# Patient Record
Sex: Male | Born: 1970 | Race: White | Hispanic: No | Marital: Married | Smoking: Current every day smoker
Health system: Southern US, Community
[De-identification: ages and names within clinical notes are randomized; demographics above are authoritative.]

## PROBLEM LIST (undated history)

## (undated) DIAGNOSIS — M069 Rheumatoid arthritis, unspecified: Secondary | ICD-10-CM

---
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
## 2009-07-25 ENCOUNTER — Ambulatory Visit: Payer: Self-pay | Admitting: Family Medicine

## 2010-11-15 ENCOUNTER — Ambulatory Visit: Payer: Self-pay | Admitting: Internal Medicine

## 2012-11-27 ENCOUNTER — Ambulatory Visit: Payer: Self-pay

## 2012-12-23 ENCOUNTER — Ambulatory Visit: Payer: Self-pay | Admitting: Unknown Physician Specialty

## 2014-05-19 ENCOUNTER — Ambulatory Visit: Payer: Self-pay | Admitting: Physician Assistant

## 2014-09-15 ENCOUNTER — Ambulatory Visit: Payer: Self-pay | Admitting: Physician Assistant

## 2014-12-20 ENCOUNTER — Ambulatory Visit
Admission: EM | Admit: 2014-12-20 | Discharge: 2014-12-20 | Disposition: A | Discharge status: Home / Self Care | Payer: Commercial Indemnity | Attending: Internal Medicine | Admitting: Internal Medicine

## 2014-12-20 ENCOUNTER — Ambulatory Visit: Payer: Commercial Indemnity

## 2014-12-20 DIAGNOSIS — M19039 Primary osteoarthritis, unspecified wrist: Secondary | ICD-10-CM

## 2014-12-20 DIAGNOSIS — M1990 Unspecified osteoarthritis, unspecified site: Secondary | ICD-10-CM | POA: Insufficient documentation

## 2014-12-20 DIAGNOSIS — M79641 Pain in right hand: Secondary | ICD-10-CM | POA: Diagnosis present

## 2014-12-20 DIAGNOSIS — M129 Arthropathy, unspecified: Secondary | ICD-10-CM

## 2014-12-20 DIAGNOSIS — Z79899 Other long term (current) drug therapy: Secondary | ICD-10-CM | POA: Insufficient documentation

## 2014-12-20 DIAGNOSIS — F17210 Nicotine dependence, cigarettes, uncomplicated: Secondary | ICD-10-CM | POA: Diagnosis not present

## 2014-12-20 MED ORDER — MELOXICAM 15 MG PO TABS: 15.0000 mg | ORAL_TABLET | Freq: Every day | ORAL | Status: DC

## 2014-12-20 NOTE — ED Provider Notes (Signed)
CSN: [id number]     Arrival date & time 12/20/14  1746 History   First MD Initiated Contact with Patient 12/20/14 1957     Chief Complaint  Patient presents with  . Hand Pain   (Consider location/radiation/quality/duration/timing/severity/associated sxs/prior Treatment) HPI   This a 44 year old male presents with a three-day history of right dominant hand pain mostly over the dorsum. He does not remember any specific injury has not been using his hand for repetitive motion. Wrist flexion and extension and abduction of his fingers seem to be the worst movements. He has slight swelling of the wrist in comparison to the left but no ecchymosis or erythema. There is slight warmth in comparison. No history of gout and is in usual good health. He works for a Location [employer] not a physical job.    History reviewed. No pertinent past medical history. Past Surgical History  Procedure Laterality Date  . Knee surgery Bilateral 1991   Family History  Problem Relation Age of Onset  . Prostate cancer Father   . Hypertension Father   . Hyperlipidemia Father    History  Substance Use Topics  . Smoking status: Current Every Day Smoker -- 1.00 packs/day for 25 years  . Smokeless tobacco: Not on file  . Alcohol Use: Yes     Comment: "couple times a week"    Review of Systems  Musculoskeletal: Positive for joint swelling.  All other systems reviewed and are negative.   Allergies  Lorabid  Home Medications   Prior to Admission medications   Medication Sig Start Date End Date Taking? Authorizing Provider  cetirizine (ZYRTEC) 10 MG tablet Take 10 mg by mouth daily.   Yes Historical Provider, MD  Multiple Vitamin (MULTIVITAMIN) capsule Take 1 capsule by mouth daily.   Yes Historical Provider, MD  meloxicam (MOBIC) 15 MG tablet Take 1 tablet (15 mg total) by mouth daily. 12/20/14   Chrissie Noa Maeven Mcdougall, PA-C   BP 137/88 mmHg  Pulse 88  Temp(Src) 98.4 F (36.9 C) (Oral)  Resp 16  Ht 6\' 5"  (1.956  m)  Wt 260 lb (117.935 kg)  BMI 30.83 kg/m2  SpO2 97% Physical Exam  Constitutional: He is oriented to person, place, and time. He appears well-developed and well-nourished.  HENT:  Head: Normocephalic and atraumatic.  Eyes: Pupils are equal, round, and reactive to light.  Musculoskeletal:  Examination of the right hand be slightly swollen in comparison to the left. There is no erythema or ecchymosis present. He is able to pronate and supinate but flexion and extension is limited by pain. There is no tenderness over the phalanges or metacarpals at the base of the third metacarpal at the carpometacarpal joint the most tender. There is no induration. Pulses are good and strong sensation is intact to light touch. Left wrist examination is entirely normal.  Neurological: He is alert and oriented to person, place, and time. He has normal reflexes.  Skin: Skin is warm and dry.  Psychiatric: He has a normal mood and affect. His behavior is normal. Judgment and thought content normal.    ED Course  Procedures (including critical care time) Labs Review Labs Reviewed - No data to display  Imaging Review Dg Wrist Complete Right  12/20/2014   CLINICAL DATA:  Dorsal hand pain over base of the third metacarpal. No known injury.  EXAM: RIGHT WRIST - COMPLETE 3+ VIEW  COMPARISON:  None.  FINDINGS: The mineralization and alignment are normal. There is no evidence of acute fracture  or dislocation. The joint spaces are maintained. There is a possible os styloideum at the base of the third metacarpal base on the PA view, although not well seen on the lateral view. No evidence of avascular necrosis.  IMPRESSION: No acute osseous findings. Potential os styloideum accounting for the patient's palpable concern. Correlate clinically.   Electronically Signed   By: Carey Bullocks M.D.   On: 12/20/2014 20:24     MDM   1. Wrist joint inflamed    I show the patient the x-rays and the report and we'll place him on  NSAID's placement a wrist splint for a week to see how he does conservatively. He continues to have discomfort I would refer him to hand surgery and have given him the name of [facility] or he can go to anywhere he pleases    Lutricia Feil, PA-C 12/20/14 2056

## 2014-12-20 NOTE — ED Notes (Signed)
Right hand swollen, red, and tender since Monday morning. Sudden onset with no known trauma to the affected hand. Pt reports the hand is severely tender to the touch.

## 2015-09-05 ENCOUNTER — Other Ambulatory Visit: Payer: Self-pay | Admitting: Internal Medicine

## 2015-09-05 ENCOUNTER — Ambulatory Visit
Admission: RE | Admit: 2015-09-05 | Discharge: 2015-09-05 | Disposition: A | Discharge status: Home / Self Care | Payer: Commercial Indemnity | Source: Ambulatory Visit | Attending: Internal Medicine | Admitting: Internal Medicine

## 2015-09-05 DIAGNOSIS — M25542 Pain in joints of left hand: Secondary | ICD-10-CM | POA: Diagnosis not present

## 2015-09-05 DIAGNOSIS — M25571 Pain in right ankle and joints of right foot: Secondary | ICD-10-CM | POA: Insufficient documentation

## 2015-09-05 DIAGNOSIS — M1990 Unspecified osteoarthritis, unspecified site: Secondary | ICD-10-CM | POA: Diagnosis present

## 2015-09-05 DIAGNOSIS — M25572 Pain in left ankle and joints of left foot: Secondary | ICD-10-CM | POA: Insufficient documentation

## 2015-09-05 DIAGNOSIS — M533 Sacrococcygeal disorders, not elsewhere classified: Secondary | ICD-10-CM | POA: Diagnosis not present

## 2015-09-05 DIAGNOSIS — M19079 Primary osteoarthritis, unspecified ankle and foot: Secondary | ICD-10-CM

## 2015-09-05 DIAGNOSIS — M25541 Pain in joints of right hand: Secondary | ICD-10-CM | POA: Diagnosis not present

## 2017-01-11 ENCOUNTER — Ambulatory Visit
Admission: EM | Admit: 2017-01-11 | Discharge: 2017-01-11 | Disposition: A | Discharge status: Home / Self Care | Payer: Commercial Indemnity | Attending: Family Medicine | Admitting: Family Medicine

## 2017-01-11 DIAGNOSIS — H00024 Hordeolum internum left upper eyelid: Secondary | ICD-10-CM

## 2017-01-11 HISTORY — DX: Rheumatoid arthritis, unspecified: M06.9

## 2017-01-11 MED ORDER — ERYTHROMYCIN 5 MG/GM OP OINT: TOPICAL_OINTMENT | OPHTHALMIC | 0 refills | Status: DC

## 2017-01-11 NOTE — ED Provider Notes (Signed)
CSN: [id number]     Arrival date & time 01/11/17  0846 History   First MD Initiated Contact with Patient 01/11/17 [phone]     Chief Complaint  Patient presents with  . Eye Problem   (Consider location/radiation/quality/duration/timing/severity/associated sxs/prior Treatment) 46 year old male presents with left upper eyelid redness and swelling that started about 3 days ago. Experiencing some cloudy to yellow discharge. Tender on upper lid and itchy. No distinct change in vision (vision is normally worse in left eye compared to right). Does not wear contacts but wears glasses (not with him today). Had routine vision exam a week ago- no issues. Also was exposed to possible conjunctivitis or pink eye by a relative earlier last week. Denies any fever, nasal congestion, ear pain, sore throat or cough. Tried OTC eye drops for eye redness with no relief. Only chronic health issue is Rheumatoid Arthritis.    The history is provided by the patient.    Past Medical History:  Diagnosis Date  . Rheumatoid arthritis [facility])    Past Surgical History:  Procedure Laterality Date  . KNEE SURGERY Bilateral 1991   Family History  Problem Relation Age of Onset  . Prostate cancer Father   . Hypertension Father   . Hyperlipidemia Father    Social History  Substance Use Topics  . Smoking status: Current Every Day Smoker    Packs/day: 1.00    Years: 25.00  . Smokeless tobacco: Never Used  . Alcohol use Yes     Comment: "couple times a week"    Review of Systems  Constitutional: Negative for appetite change, chills, fatigue and fever.  HENT: Negative for congestion, ear discharge, ear pain, facial swelling, postnasal drip, rhinorrhea, sinus pain, sinus pressure, sneezing and sore throat.   Eyes: Positive for pain (left upper lid sore), discharge (from eyelid) and itching. Negative for photophobia, redness and visual disturbance.  Respiratory: Negative for cough, chest tightness, shortness of breath and  wheezing.   Cardiovascular: Negative for chest pain.  Gastrointestinal: Negative for diarrhea, nausea and vomiting.  Musculoskeletal: Negative for back pain and neck pain.  Skin: Positive for color change. Negative for rash and wound.  Allergic/Immunologic: Positive for immunocompromised state.  Neurological: Negative for dizziness, tremors, syncope, facial asymmetry, speech difficulty, weakness, light-headedness, numbness and headaches.  Hematological: Negative for adenopathy.    Allergies  Lorabid [loracarbef]  Home Medications   Prior to Admission medications   Medication Sig Start Date End Date Taking? Authorizing Provider  cetirizine (ZYRTEC) 10 MG tablet Take 10 mg by mouth daily.   Yes [provider]  cyclobenzaprine (FLEXERIL) 10 MG tablet Take 10 mg by mouth 3 (three) times daily as needed for muscle spasms.   Yes [provider]  Etanercept (ENBREL) 25 MG/0.5ML SOSY Inject into the skin.   Yes [provider]  HYDROcodone-acetaminophen (NORCO/VICODIN) 5-325 MG tablet Take 2 tablets by mouth every 6 (six) hours as needed for moderate pain.   Yes [provider]  ibuprofen (ADVIL,MOTRIN) 800 MG tablet Take 800 mg by mouth every 8 (eight) hours as needed.   Yes [provider]  Multiple Vitamin (MULTIVITAMIN) capsule Take 1 capsule by mouth daily.   Yes [provider]  omeprazole (PRILOSEC) 10 MG capsule Take 10 mg by mouth daily.   Yes [provider]  erythromycin ophthalmic ointment Place a 1/2 inch ribbon of ointment into the lower eyelid 4 times a day. 01/11/17   Sudie Grumbling, NP   Meds Ordered  and Administered this Visit  Medications - No data to display  BP (!) 120/93 (BP Location: Left Arm)   Pulse 81   Temp 98.2 F (36.8 C) (Oral)   Resp 18   Ht 6\' 5"  (1.956 m)   Wt 255 lb (115.7 kg)   SpO2 98%   BMI 30.24 kg/m  No data found.   Physical Exam  Constitutional: He is oriented to person, place,  and time. He appears well-developed and well-nourished. No distress.  HENT:  Head: Normocephalic and atraumatic.  Right Ear: Hearing and external ear normal.  Left Ear: Hearing and external ear normal.  Nose: Nose normal.  Mouth/Throat: Oropharynx is clear and moist.  Eyes: EOM are normal. Pupils are equal, round, and reactive to light. Right eye exhibits no discharge and no hordeolum. No foreign body present in the right eye. Left eye exhibits discharge and hordeolum. No foreign body present in the left eye. Right conjunctiva is not injected. Left conjunctiva is not injected.    Small red stye present on left upper inner lateral aspect of eyelid. Slight yellowish drainage present. Upper lid swollen and red and tender. Conjunctiva is normal.   Neck: Normal range of motion. Neck supple.  Cardiovascular: Normal rate and regular rhythm.   Pulmonary/Chest: Effort normal.  Lymphadenopathy:    He has no cervical adenopathy.  Neurological: He is alert and oriented to person, place, and time.  Skin: Skin is warm, dry and intact. There is erythema (on left upper lateral 2/3 of eyelid).  Psychiatric: He has a normal mood and affect. His behavior is normal. Judgment and thought content normal.    Urgent Care Course     Procedures (including critical care time)  Labs Review Labs Reviewed - No data to display  Imaging Review No results found.   Visual Acuity Review  Right Eye Distance: 20/15 (corrected) Left Eye Distance: 20/70 (corrected) due to swelling of eyelid  Bilateral Distance: 20/25 (corrected)  Right Eye Near:   Left Eye Near:    Bilateral Near:         MDM   1. Hordeolum internum of left upper eyelid    Recommend start Erythromycin ointment- place a small layer in left eye 4 times a day as directed. Continue warm compresses to eyelid for comfort. Wash hands frequently. Follow-up with an Eye doctor (information provided for [facility]) in 2 to 3 days if not  improving.      , NP 01/12/17 [phone]

## 2017-01-11 NOTE — ED Triage Notes (Signed)
Patient complains of left eye swelling. Patient states that area is painful and noticed visual changes. Patient states that he recently had eyes examined last Monday.

## 2017-01-11 NOTE — Discharge Instructions (Addendum)
Recommend start Erythromycin ointment- place a small layer in left eye 4 times a day as directed. Continue warm compresses to eyelid for comfort. Follow-up with an Eye doctor in 2 to 3 days if not improving.

## 2017-02-17 IMAGING — CR DG FOOT 2V*R*
2 series · 2 of 2 positions shown · non-contrast
Comparison: Concurrently obtained radiographs of the left foot

CLINICAL DATA: 44-year-old male with painful feet

EXAM:
RIGHT FOOT - 2 VIEW

[foot ap]
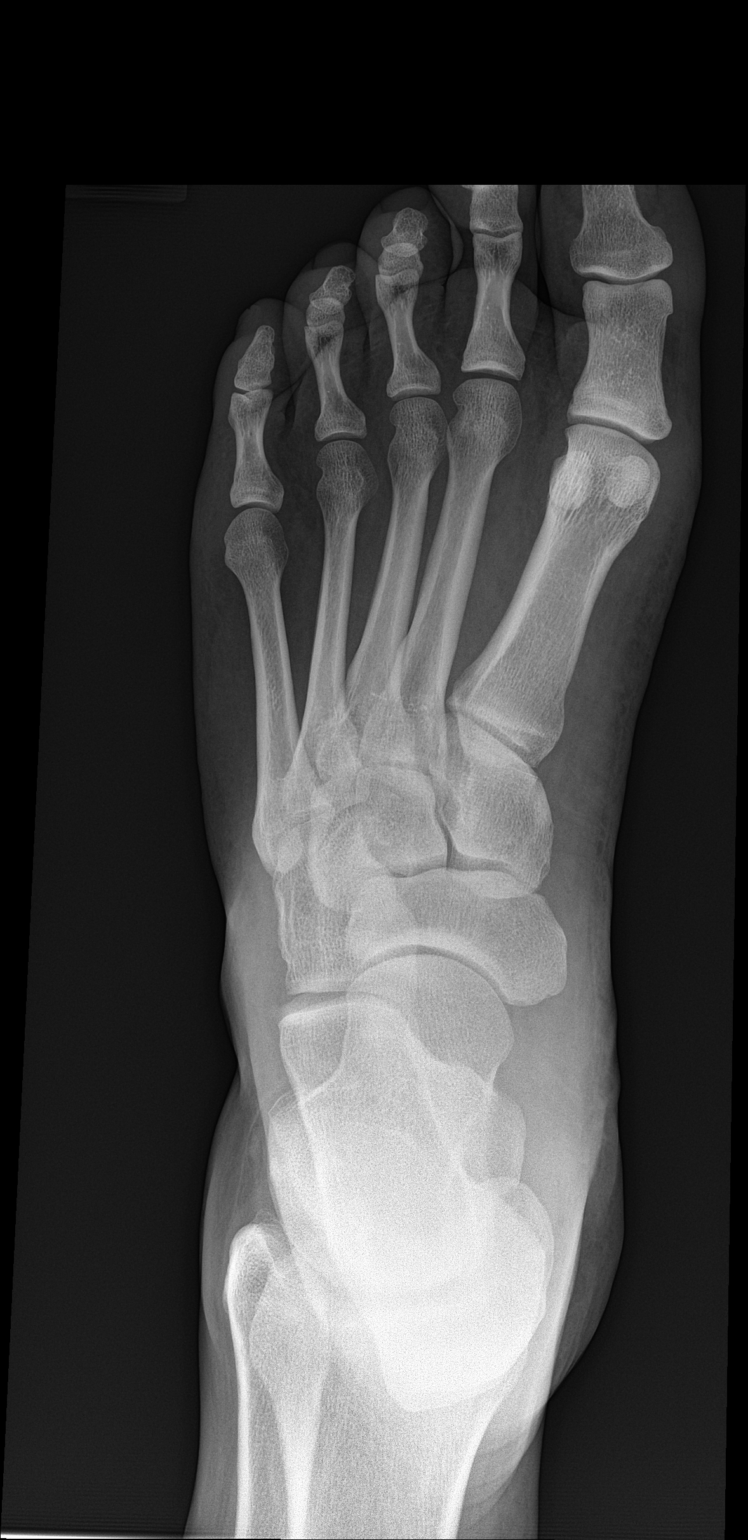

[foot lat]
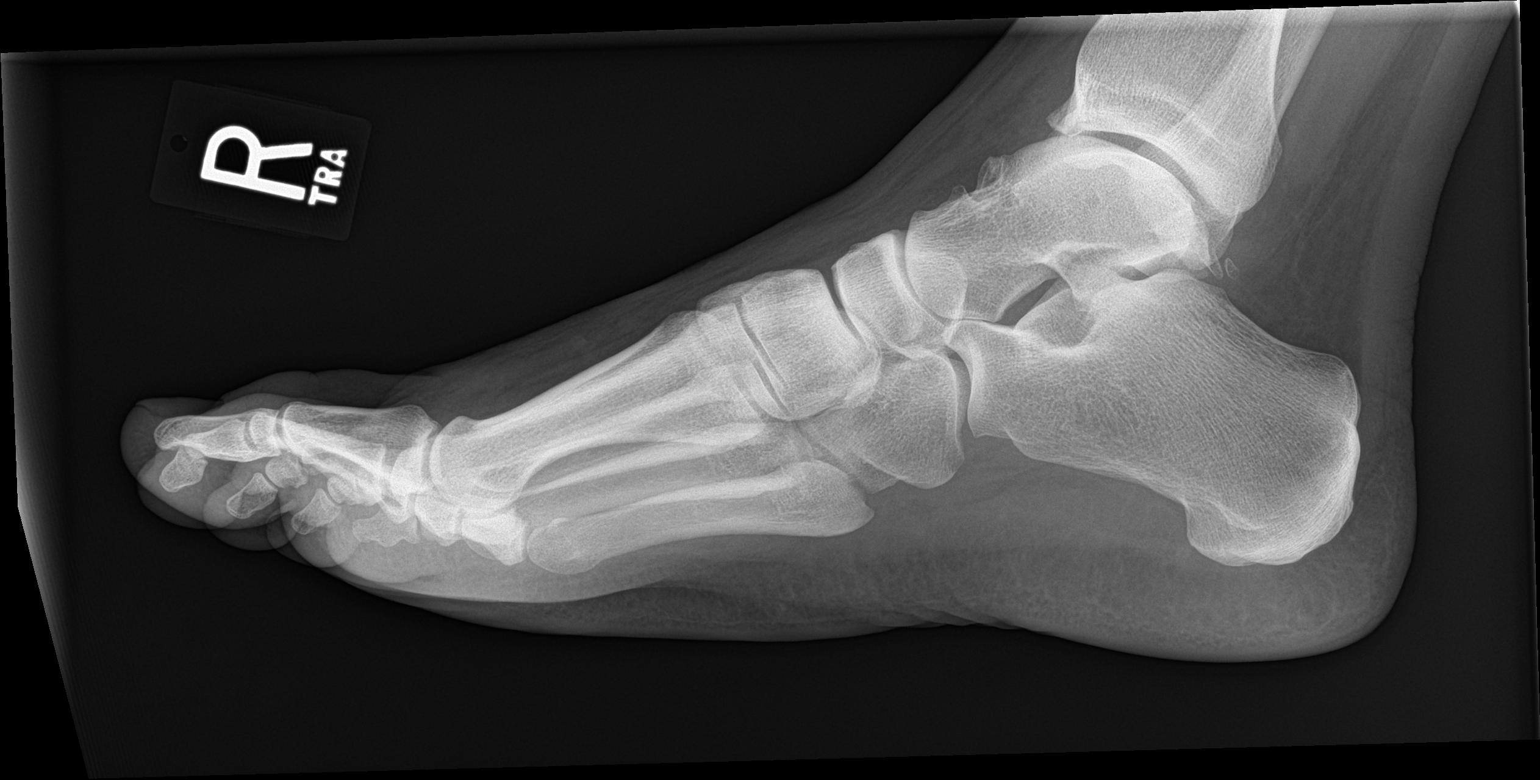

[2 of 2 positions shown; findings below may reference images not displayed]

FINDINGS: There is no evidence of fracture or dislocation. There is no
evidence of arthropathy or other focal bone abnormality. Soft
tissues are unremarkable. Small os trigonum.
IMPRESSION: Negative.

## 2017-02-17 IMAGING — CR DG FOOT 2V*L*
2 series · 2 of 2 positions shown · non-contrast
Comparison: Concurrently obtained radiographs of the right foot

CLINICAL DATA: 44-year-old male with painful feet

EXAM:
LEFT FOOT - 2 VIEW

[foot ap]
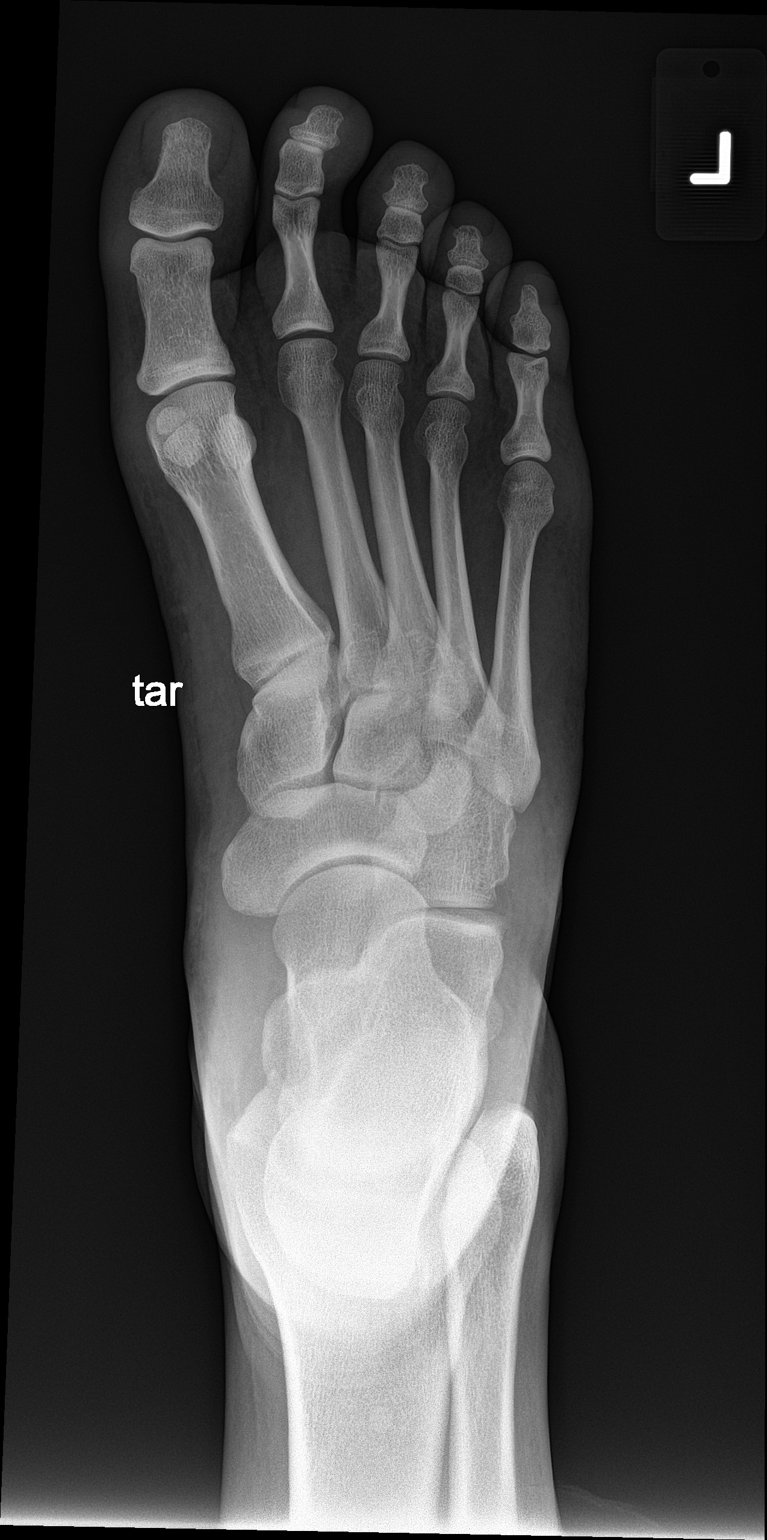

[foot lat]
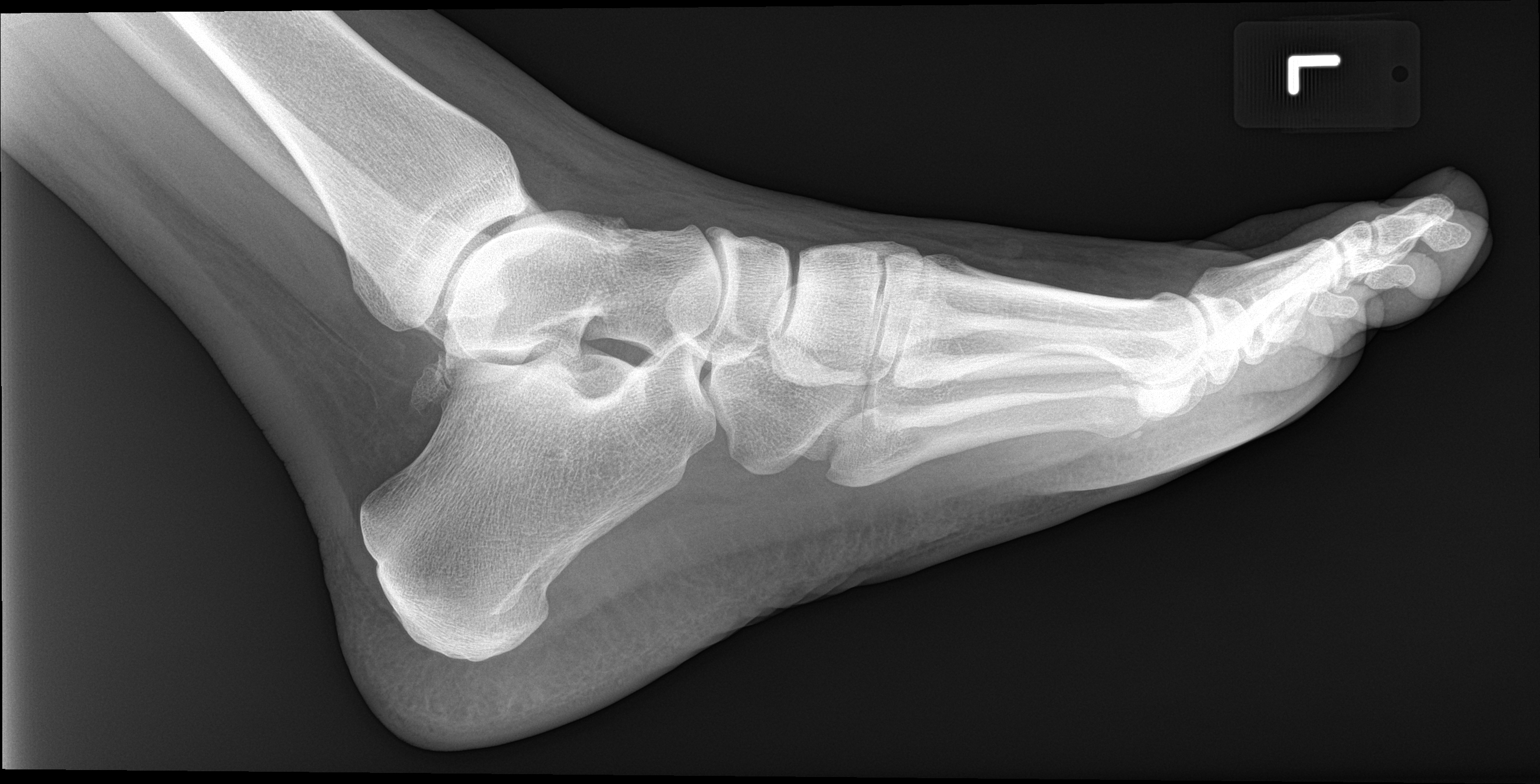

[2 of 2 positions shown; findings below may reference images not displayed]

FINDINGS: There is no evidence of fracture or dislocation. There is no
evidence of arthropathy or other focal bone abnormality. Soft
tissues are unremarkable. Os trigonum noted incidentally.
IMPRESSION: Negative.

## 2017-02-17 IMAGING — CR DG HAND COMPLETE 3+V*L*
3 series · 3 of 3 positions shown · non-contrast
Comparison: Concurrently obtained radiographs of the right hands

CLINICAL DATA: 44-year-old male with painful swollen hands

EXAM:
LEFT HAND - COMPLETE 3+ VIEW

[hand ap]
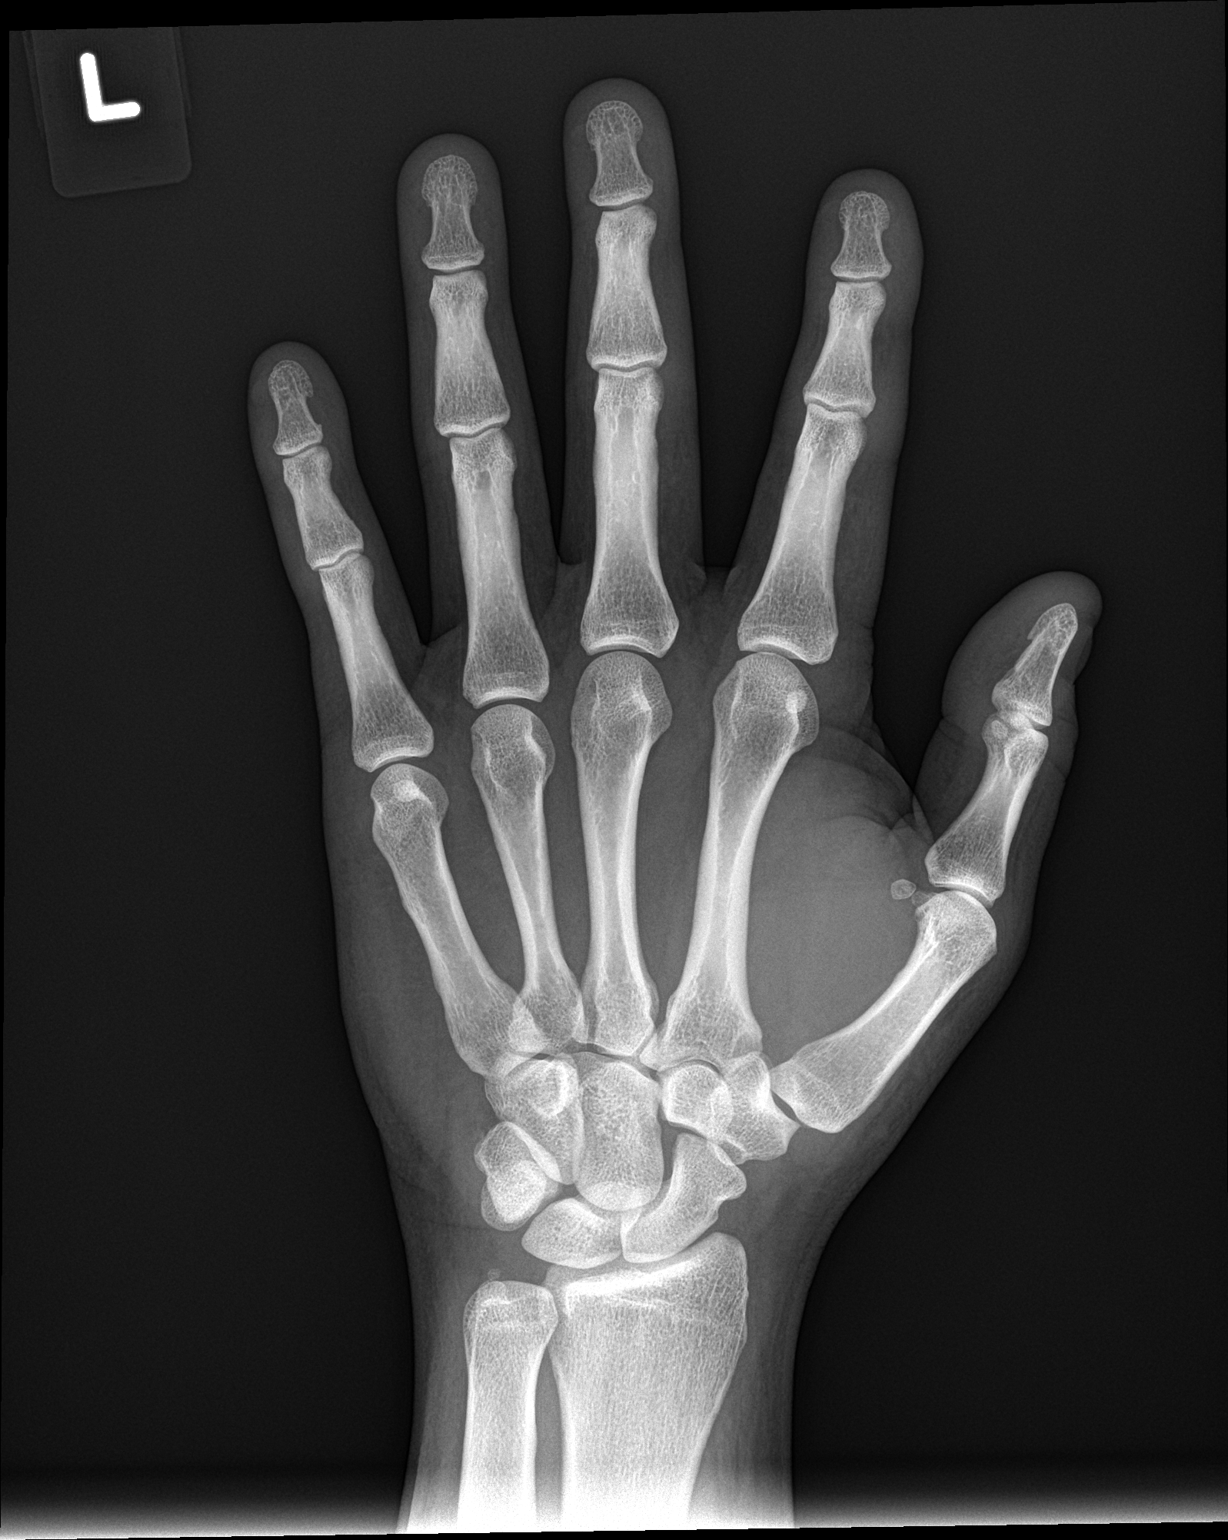

[hand obl]
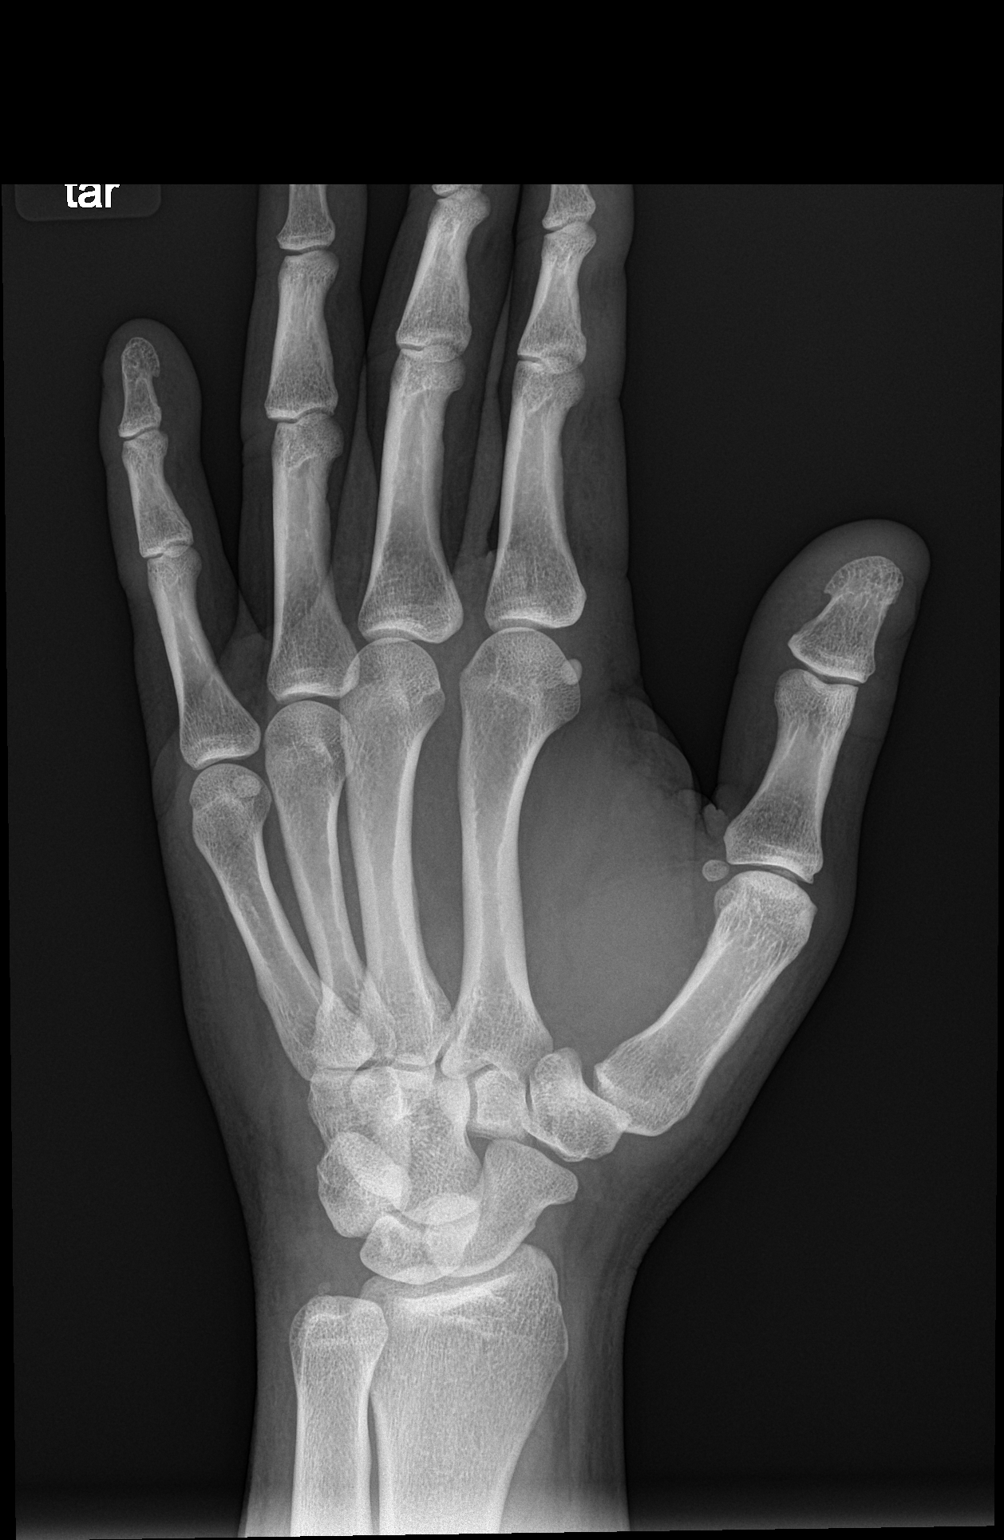

[hand lat]
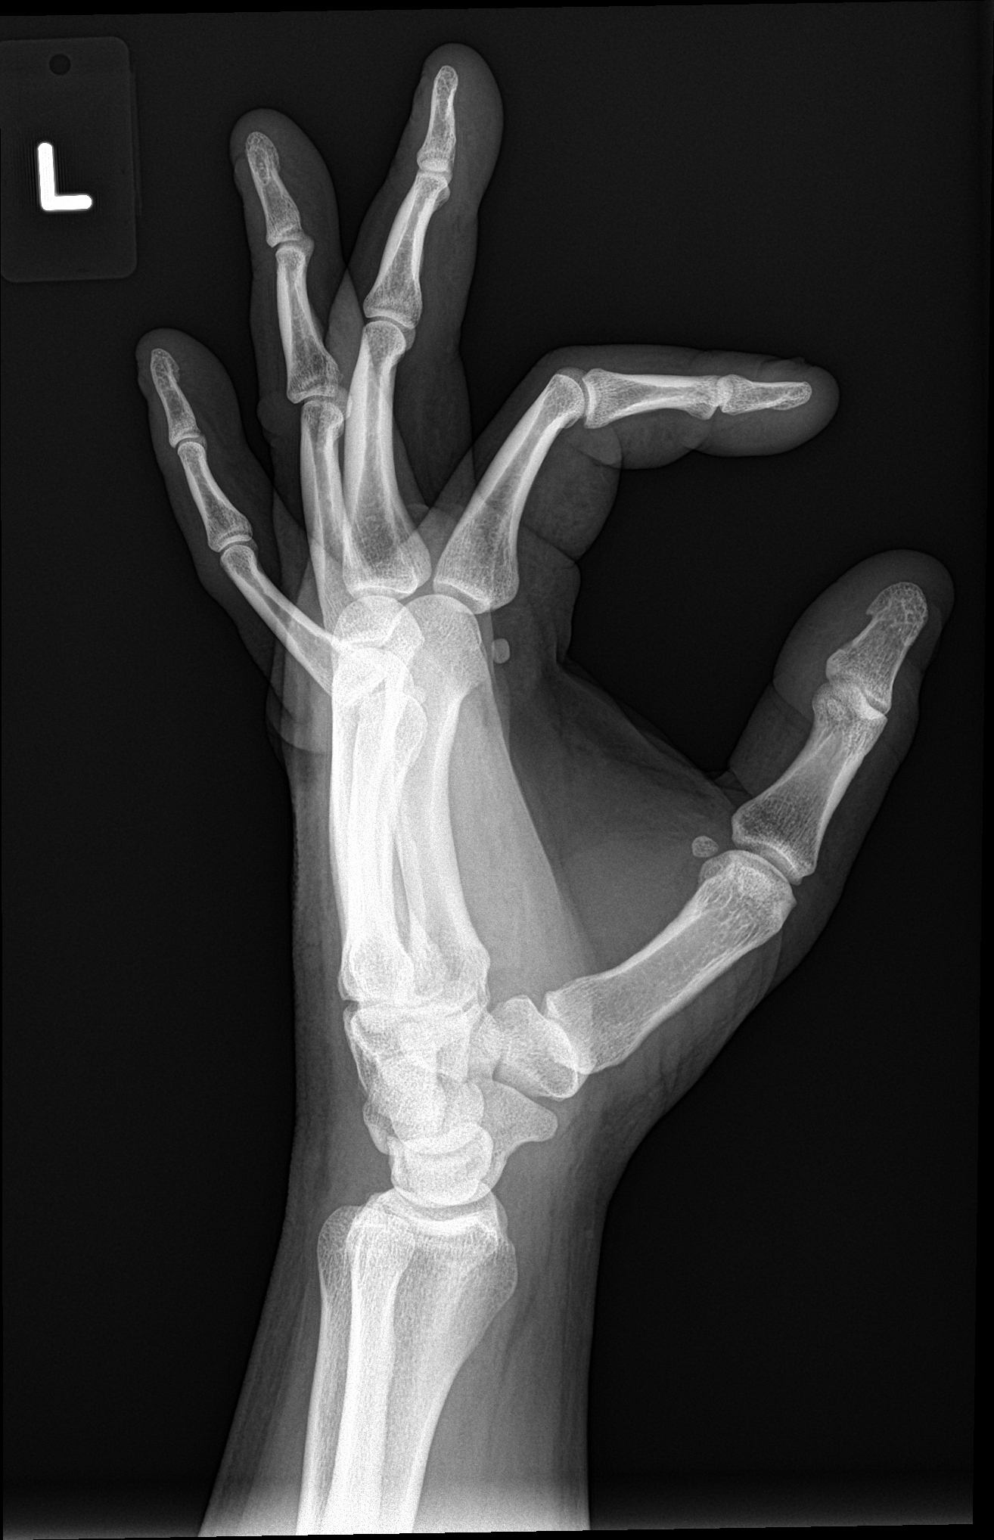

[3 of 3 positions shown; findings below may reference images not displayed]

FINDINGS: There is no evidence of fracture or dislocation. There is no
evidence of arthropathy or other focal bone abnormality. Soft
tissues are unremarkable.
IMPRESSION: Negative.

## 2017-07-28 ENCOUNTER — Encounter: Payer: Self-pay | Admitting: *Deleted

## 2017-07-28 ENCOUNTER — Other Ambulatory Visit: Payer: Self-pay

## 2017-07-28 ENCOUNTER — Emergency Department
Admission: EM | Admit: 2017-07-28 | Discharge: 2017-07-28 | Disposition: A | Discharge status: Home / Self Care | Payer: Managed Care, Other (non HMO) | Attending: Emergency Medicine | Admitting: Emergency Medicine

## 2017-07-28 DIAGNOSIS — F17210 Nicotine dependence, cigarettes, uncomplicated: Secondary | ICD-10-CM | POA: Insufficient documentation

## 2017-07-28 DIAGNOSIS — Z79899 Other long term (current) drug therapy: Secondary | ICD-10-CM | POA: Insufficient documentation

## 2017-07-28 DIAGNOSIS — M069 Rheumatoid arthritis, unspecified: Secondary | ICD-10-CM | POA: Diagnosis present

## 2017-07-28 LAB — COMPREHENSIVE METABOLIC PANEL
ALT: 16 U/L — ABNORMAL LOW (ref 17–63)
AST: 18 U/L (ref 15–41)
Albumin: 3.9 g/dL (ref 3.5–5.0)
Alkaline Phosphatase: 59 U/L (ref 38–126)
Anion gap: 7 (ref 5–15)
BUN: 15 mg/dL (ref 6–20)
CO2: 26 mmol/L (ref 22–32)
Calcium: 8.9 mg/dL (ref 8.9–10.3)
Chloride: 104 mmol/L (ref 101–111)
Creatinine, Ser: 0.92 mg/dL (ref 0.61–1.24)
GFR calc Af Amer: >60 mL/min (ref >60)
GFR calc non Af Amer: >60 mL/min (ref >60)
Glucose, Bld: 101 mg/dL — ABNORMAL HIGH (ref 65–99)
Potassium: 4 mmol/L (ref 3.5–5.1)
Sodium: 137 mmol/L (ref 135–145)
Total Bilirubin: 0.6 mg/dL (ref 0.3–1.2)
Total Protein: 8.1 g/dL (ref 6.5–8.1)

## 2017-07-28 LAB — CBC WITH DIFFERENTIAL/PLATELET
Basophils Absolute: 0.1 10*3/uL (ref 0–0.1)
Basophils Relative: 1 %
Eosinophils Absolute: 0.4 10*3/uL (ref 0–0.7)
Eosinophils Relative: 3 %
HCT: 41.6 % (ref 40.0–52.0)
Hemoglobin: 14.3 g/dL (ref 13.0–18.0)
Lymphocytes Relative: 33 %
Lymphs Abs: 4.8 10*3/uL — ABNORMAL HIGH (ref 1.0–3.6)
MCH: 31.3 pg (ref 26.0–34.0)
MCHC: 34.3 g/dL (ref 32.0–36.0)
MCV: 91.3 fL (ref 80.0–100.0)
Monocytes Absolute: 1.7 10*3/uL — ABNORMAL HIGH (ref 0.2–1.0)
Monocytes Relative: 12 %
Neutro Abs: 7.5 10*3/uL — ABNORMAL HIGH (ref 1.4–6.5)
Neutrophils Relative %: 51 %
Platelets: 319 10*3/uL (ref 150–440)
RBC: 4.55 MIL/uL (ref 4.40–5.90)
RDW: 14.2 % (ref 11.5–14.5)
WBC: 14.6 10*3/uL — ABNORMAL HIGH (ref 3.8–10.6)

## 2017-07-28 MED ORDER — FENTANYL 50 MCG/HR TD PT72
50.0000 ug | MEDICATED_PATCH | TRANSDERMAL | Status: DC
  Administered 2017-07-28: 50 ug via TRANSDERMAL
  Filled 2017-07-28: qty 1

## 2017-07-28 MED ORDER — MORPHINE SULFATE (PF) 2 MG/ML IV SOLN
INTRAVENOUS | Status: AC
  Filled 2017-07-28: qty 1

## 2017-07-28 MED ORDER — KETOROLAC TROMETHAMINE 10 MG PO TABS: 10.0000 mg | ORAL_TABLET | Freq: Four times a day (QID) | ORAL | 0 refills | Status: DC | PRN

## 2017-07-28 MED ORDER — MORPHINE SULFATE (PF) 2 MG/ML IV SOLN
2.0000 mg | Freq: Once | INTRAVENOUS | Status: AC
  Administered 2017-07-28: 2 mg via INTRAVENOUS

## 2017-07-28 MED ORDER — KETOROLAC TROMETHAMINE 30 MG/ML IJ SOLN
INTRAMUSCULAR | Status: AC
  Administered 2017-07-28: 30 mg via INTRAVENOUS
  Filled 2017-07-28: qty 1

## 2017-07-28 MED ORDER — KETOROLAC TROMETHAMINE 30 MG/ML IJ SOLN
30.0000 mg | Freq: Once | INTRAMUSCULAR | Status: AC
  Administered 2017-07-28: 30 mg via INTRAVENOUS

## 2017-07-28 NOTE — ED Triage Notes (Signed)
Pt reports he has RA and is taking meds without pain relief.  Pt reports a RA flare. Pt is on steroids.  Pt has joint pain all over his body.  Pt alert.  Speech clear.

## 2017-07-28 NOTE — ED Provider Notes (Signed)
[facility] Emergency Department Provider Note _   First MD Initiated Contact with Patient 07/28/17 [phone]     (approximate)  I have reviewed the triage vital signs and the nursing notes.   HISTORY  Chief Complaint Rheumatoid Arthritis    HPI Marcus Freeman is a 47 y.o. male history of rheumatoid arthritis presents to the emergency department with 10 out of 10 generalized joint pain states is a rheumatoid arthritis flare that is unrelieved with Percocet at home.  Patient states that he has had recent rheumatoid flares requiring visits to Dr. Terie Purser at which point he was given a Toradol injection as per record with improvement of pain advised to go to the emergency department or urgent care if pain occurred at home for an additional Toradol shot.  Patient denies any fever.  Patient states pain is worse with any movement.   Past Medical History:  Diagnosis Date  . Rheumatoid arthritis (HCC)     There are no active problems to display for this patient.   Past Surgical History:  Procedure Laterality Date  . KNEE SURGERY Bilateral 1991    Prior to Admission medications   Medication Sig Start Date End Date Taking? Authorizing Provider  cetirizine (ZYRTEC) 10 MG tablet Take 10 mg by mouth daily.    [provider]  cyclobenzaprine (FLEXERIL) 10 MG tablet Take 10 mg by mouth 3 (three) times daily as needed for muscle spasms.    [provider]  erythromycin ophthalmic ointment Place a 1/2 inch ribbon of ointment into the lower eyelid 4 times a day. 01/11/17   Sudie Grumbling, NP  Etanercept (ENBREL) 25 MG/0.5ML SOSY Inject into the skin.    [provider]  HYDROcodone-acetaminophen (NORCO/VICODIN) 5-325 MG tablet Take 2 tablets by mouth every 6 (six) hours as needed for moderate pain.    [provider]  ibuprofen (ADVIL,MOTRIN) 800 MG tablet Take 800 mg by mouth every 8 (eight) hours as needed.    [provider]  Multiple Vitamin (MULTIVITAMIN) capsule Take 1 capsule by mouth daily.    [provider]  omeprazole (PRILOSEC) 10 MG capsule Take 10 mg by mouth daily.    [provider]    Allergies Lorabid [loracarbef]  Family History  Problem Relation Age of Onset  . Prostate cancer Father   . Hypertension Father   . Hyperlipidemia Father     Social History Social History   Tobacco Use  . Smoking status: Current Every Day Smoker    Packs/day: 1.00    Years: 25.00    Pack years: 25.00  . Smokeless tobacco: Never Used  Substance Use Topics  . Alcohol use: Yes    Comment: "couple times a week"  . Drug use: No    Review of Systems Constitutional: No fever/chills Eyes: No visual changes. ENT: No sore throat. Cardiovascular: Denies chest pain. Respiratory: Denies shortness of breath. Gastrointestinal: No abdominal pain.  No nausea, no vomiting.  No diarrhea.  No constipation. Genitourinary: Negative for dysuria. Musculoskeletal: Negative for neck pain.  Negative for back pain.  Positive for generalized joint pain Integumentary: Negative for rash. Neurological: Negative for headaches, focal weakness or numbness.   ____________________________________________   PHYSICAL EXAM:  VITAL SIGNS: ED Triage Vitals  Enc Vitals Group     BP 07/28/17 0255 135/87     Pulse Rate 07/28/17 0255 94     Resp 07/28/17 0255 18     Temp 07/28/17 0255 98.1  F (36.7 C)     Temp Source 07/28/17 0255 Oral     SpO2 07/28/17 0255 98 %     Weight 07/28/17 0255 113.4 kg (250 lb)     Height 07/28/17 0255 1.956 m (6\' 5" )     Head Circumference --      Peak Flow --      Pain Score 07/28/17 0258 10     Pain Loc --      Pain Edu? --      Excl. in GC? --     Constitutional: Alert and oriented.  Apparent discomfort Eyes: Conjunctivae are normal.  Head: Atraumatic. Mouth/Throat: Mucous membranes are moist.  Oropharynx non-erythematous. Neck: No stridor.     Cardiovascular: Normal rate, regular rhythm. Good peripheral circulation. Grossly normal heart sounds. Respiratory: Normal respiratory effort.  No retractions. Lungs CTAB. Gastrointestinal: Soft and nontender. No distention.  Musculoskeletal: Pain bilateral hands wrists elbows shoulders with very gentle palpation.. Neurologic:  Normal speech and language. No gross focal neurologic deficits are appreciated.  Skin:  Skin is warm, dry and intact. No rash noted.   ____________________________________________   LABS (all labs ordered are listed, but only abnormal results are displayed)  Labs Reviewed  CBC WITH DIFFERENTIAL/PLATELET - Abnormal; Notable for the following components:      Result Value   WBC 14.6 (*)    Neutro Abs 7.5 (*)    Lymphs Abs 4.8 (*)    Monocytes Absolute 1.7 (*)    All other components within normal limits  COMPREHENSIVE METABOLIC PANEL - Abnormal; Notable for the following components:   Glucose, Bld 101 (*)    ALT 16 (*)    All other components within normal limits   _    Procedures   ____________________________________________   INITIAL IMPRESSION / ASSESSMENT AND PLAN / ED COURSE  As part of my medical decision making, I reviewed the following data within the electronic MEDICAL RECORD NUMBERPatient given IV Toradol 30 mg with improvement of pain however patient states with any movement pain quickly returns to 9 out of 10 and as such patient was given morphine 2 mg iv with further pain improvement.  Patient will be prescribed Toradol p.o. for home patient given a fentanyl 50 mcg patch and advised to apply if pain not controlled with Toradol pending follow-up with rheumatology.  Spoke with patient at length regarding the side effects of fentanyl and advised not to drive if the need arise for fentanyl application. ____________________________________________  FINAL CLINICAL IMPRESSION(S) / ED DIAGNOSES  Final diagnoses:  Rheumatoid arthritis involving  multiple sites, unspecified rheumatoid factor presence (HCC)     MEDICATIONS GIVEN DURING THIS VISIT:  Medications  fentaNYL (DURAGESIC - dosed mcg/hr) 50 mcg (not administered)  ketorolac (TORADOL) 30 MG/ML injection 30 mg (30 mg Intravenous Given 07/28/17 0445)  morphine 2 MG/ML injection 2 mg (2 mg Intravenous Given 07/28/17 0505)     ED Discharge Orders    None       Note:  This document was prepared using Dragon voice recognition software and may include unintentional dictation errors.    09/25/17, MD 07/28/17 [phone]

## 2017-07-28 NOTE — ED Notes (Signed)
Called pharmacy for fentanyl patch; they will verify and send

## 2017-07-28 NOTE — ED Notes (Signed)
Pt discharged home after verbalizing understanding of discharge instructions; nad noted. 

## 2017-07-28 NOTE — ED Notes (Signed)
Neither signature pad nor barcode scanner working; called IT and they will send someone out.

## 2020-03-24 ENCOUNTER — Ambulatory Visit
Admission: RE | Admit: 2020-03-24 | Discharge: 2020-03-24 | Disposition: A | Discharge status: Home / Self Care | Payer: Managed Care, Other (non HMO) | Source: Ambulatory Visit

## 2020-03-24 ENCOUNTER — Other Ambulatory Visit: Payer: Self-pay

## 2020-03-24 VITALS — BP 124/92 | HR 75 | Temp 98.2°F | Resp 16 | Ht 77.0 in | Wt 240.0 lb

## 2020-03-24 DIAGNOSIS — L02416 Cutaneous abscess of left lower limb: Secondary | ICD-10-CM

## 2020-03-24 MED ORDER — DOXYCYCLINE HYCLATE 100 MG PO CAPS
100.0000 mg | ORAL_CAPSULE | Freq: Two times a day (BID) | ORAL | 0 refills | Status: AC
Start: 2020-03-24 — End: 2020-04-07

## 2020-03-24 NOTE — ED Provider Notes (Signed)
[facility]    CSN: [id number] Arrival date & time: 03/24/20  1114      History   Chief Complaint Chief Complaint  Patient presents with   Appointment   Abscess    left thigh    HPI Marcus Freeman is a 49 y.o. male.   49 year old male presents for abscess of the left inner thigh.  He says that he was treated for an abscess at the end of last month with 10 days of doxycycline.  He says that there was still a little bit of the infection left, but he was told it would just go away on its own.  Over the last 2 days he says he has had recurrence of the abscess in the same area and it is enlarging.  He says it started to drain today with small amount of pustular fluid.  He denies fever, fatigue or weakness.  He does have a history of rheumatoid arthritis.  No other complaints or concerns today.     Past Medical History:  Diagnosis Date   Rheumatoid arthritis (HCC)     There are no problems to display for this patient.   Past Surgical History:  Procedure Laterality Date   KNEE SURGERY Bilateral 1991       Home Medications    Prior to Admission medications   Medication Sig Start Date End Date Taking? Authorizing Provider  cyclobenzaprine (FLEXERIL) 10 MG tablet Take 10 mg by mouth 3 (three) times daily as needed for muscle spasms.   Yes [provider]  ergocalciferol (VITAMIN D2) 1.25 MG (50000 UT) capsule Take 1 capsule by mouth once a week. 08/24/17  Yes [provider]  ibuprofen (ADVIL,MOTRIN) 800 MG tablet Take 800 mg by mouth every 8 (eight) hours as needed.   Yes [provider]  Multiple Vitamin (MULTIVITAMIN) capsule Take 1 capsule by mouth daily.   Yes [provider]  predniSONE (DELTASONE) 5 MG tablet 6 day taper 6 tablets, 5 tablets, 4 tablets, 3 tablets, 2 tablets, 1 tablet Till finished. 06/14/15  Yes [provider]  cetirizine (ZYRTEC) 10 MG tablet Take 10 mg by mouth daily.    [provider]  erythromycin ophthalmic ointment Place a 1/2 inch ribbon of ointment into the lower eyelid 4 times a day. 01/11/17   Sudie Grumbling, NP  Etanercept (ENBREL) 25 MG/0.5ML SOSY Inject into the skin.    [provider]  HYDROcodone-acetaminophen (NORCO/VICODIN) 5-325 MG tablet Take 2 tablets by mouth every 6 (six) hours as needed for moderate pain.    [provider]  ketorolac (TORADOL) 10 MG tablet Take 1 tablet (10 mg total) by mouth every 6 (six) hours as needed. 07/28/17   Darci Current, MD  omeprazole (PRILOSEC) 10 MG capsule Take 10 mg by mouth daily.    [provider]    Family History Family History  Problem Relation Age of Onset   Prostate cancer Father    Hypertension Father    Hyperlipidemia Father     Social History Social History   Tobacco Use   Smoking status: Current Every Day Smoker    Packs/day: 1.00    Years: 25.00    Pack years: 25.00   Smokeless tobacco: Never Used  Building services engineer Use: Never used  Substance Use Topics   Alcohol use: Yes    Comment: "couple times a week"   Drug use: No     Allergies  Lorabid [loracarbef]   Review of Systems Review of Systems  Constitutional: Negative for fatigue and fever.  Gastrointestinal: Negative for nausea and vomiting.  Musculoskeletal: Negative for gait problem, joint swelling and myalgias.  Skin: Positive for color change. Negative for rash and wound.  Allergic/Immunologic: Positive for immunocompromised state.  Neurological: Negative for weakness and numbness.  Hematological: Negative for adenopathy.     Physical Exam Triage Vital Signs ED Triage Vitals  Enc Vitals Group     BP 03/24/20 1130 (!) 124/92     Pulse Rate 03/24/20 1130 75     Resp 03/24/20 1130 16     Temp 03/24/20 1130 98.2 F (36.8 C)     Temp Source 03/24/20 1130 Oral     SpO2 03/24/20 1130 99 %     Weight 03/24/20 1127 240 lb (108.9 kg)     Height 03/24/20 1127 6\' 5"   (1.956 m)     Head Circumference --      Peak Flow --      Pain Score 03/24/20 1127 1     Pain Loc --      Pain Edu? --      Excl. in GC? --    No data found.  Updated Vital Signs BP (!) 124/92 (BP Location: Right Arm)    Pulse 75    Temp 98.2 F (36.8 C) (Oral)    Resp 16    Ht 6\' 5"  (1.956 m)    Wt 240 lb (108.9 kg)    SpO2 99%    BMI 28.46 kg/m      Physical Exam Vitals and nursing note reviewed.  Constitutional:      General: He is not in acute distress.    Appearance: Normal appearance. He is well-developed. He is not ill-appearing or toxic-appearing.  HENT:     Head: Normocephalic and atraumatic.  Eyes:     General: No scleral icterus.    Extraocular Movements: Extraocular movements intact.     Conjunctiva/sclera: Conjunctivae normal.     Pupils: Pupils are equal, round, and reactive to light.  Cardiovascular:     Rate and Rhythm: Normal rate and regular rhythm.  Pulmonary:     Effort: Pulmonary effort is normal. No respiratory distress.     Breath sounds: Normal breath sounds.  Musculoskeletal:     Cervical back: Normal range of motion and neck supple.  Skin:    General: Skin is warm and dry.     Findings: No rash.     Comments: Of the left medial thigh: There is an area of induration and erythema that is approximately 4 cm in diameter.  The center of the lesion is draining a small amount of purulent fluid.  Area is tender to palpation.  There is no fluctuance.  Neurological:     General: No focal deficit present.     Mental Status: He is alert. Mental status is at baseline.     Motor: No weakness.     Coordination: Coordination normal.     Gait: Gait normal.  Psychiatric:        Mood and Affect: Mood normal.        Behavior: Behavior normal.        Thought Content: Thought content normal.      UC Treatments / Results  Labs (all labs ordered are listed, but only abnormal results are displayed) Labs Reviewed - No data to display  EKG   Radiology No  results found.  Procedures  Procedures (including critical care time)  Medications Ordered in UC Medications - No data to display  Initial Impression / Assessment and Plan / UC Course  I have reviewed the triage vital signs and the nursing notes.  Pertinent labs & imaging results that were available during my care of the patient were reviewed by me and considered in my medical decision making (see chart for details).  The abscess is consistent with the previous abscess, extended the doxycycline to 14-day course at this time to ensure complete treatment of the abscess.  He states the abscess was staph aureus.  He denies MRSA.  Advised him to use warm compresses and change bandage frequently.  Advised him to seek referral to dermatologist since this is a recurrent problem.  Follow-up as needed.  Declines AVS.  Final Clinical Impressions(s) / UC Diagnoses   Final diagnoses:  Abscess of left thigh   Discharge Instructions   None    ED Prescriptions    None     PDMP not reviewed this encounter.   Shirlee Latch, PA-C 03/24/20 1216

## 2020-03-24 NOTE — ED Triage Notes (Signed)
Patient c/o left thigh abscess that started couple week ago but never went away.  Patient started on Doxycycline on 03/02/20. Patient denies fevers.  Patient denies drainage from site.

## 2020-05-29 ENCOUNTER — Ambulatory Visit
Admission: EM | Admit: 2020-05-29 | Discharge: 2020-05-29 | Disposition: A | Discharge status: Home / Self Care | Payer: Managed Care, Other (non HMO)

## 2020-05-29 ENCOUNTER — Other Ambulatory Visit: Payer: Self-pay

## 2020-05-29 ENCOUNTER — Ambulatory Visit: Payer: Self-pay

## 2020-05-29 DIAGNOSIS — L0291 Cutaneous abscess, unspecified: Secondary | ICD-10-CM | POA: Diagnosis not present

## 2020-05-29 MED ORDER — DOXYCYCLINE HYCLATE 100 MG PO CAPS: 100.0000 mg | ORAL_CAPSULE | Freq: Two times a day (BID) | ORAL | 0 refills | Status: DC

## 2020-05-29 NOTE — Discharge Instructions (Signed)
Apply heat to the area to promote further drainage.  Cleanse area daily with soap and water.  Keep covered to keep protected and collect drainage.   Complete course of antibiotics.  Return for any further concerns or worsening of symptoms.

## 2020-05-29 NOTE — ED Provider Notes (Signed)
[facility]    CSN: [id number] Arrival date & time: 05/29/20  0847      History   Chief Complaint Chief Complaint  Patient presents with  . Abscess    HPI Marcus Freeman is a 49 y.o. male.   [name] presents with complaints of abscess to left proximal thigh. Noted first three days ago and has increased in size and tenderness. No active drainage. No fevers. Has had similar in the past, required antibiotics for abscess in same location in September of this year. Has required I&D in the past to similar to the right thigh.    ROS per HPI, negative if not otherwise mentioned.      Past Medical History:  Diagnosis Date  . Rheumatoid arthritis (HCC)     There are no problems to display for this patient.   Past Surgical History:  Procedure Laterality Date  . KNEE SURGERY Bilateral 1991       Home Medications    Prior to Admission medications   Medication Sig Start Date End Date Taking? Authorizing Provider  Abatacept (ORENCIA) 125 MG/ML SOSY Inject into the skin.   Yes [provider]  cetirizine (ZYRTEC) 10 MG tablet Take 10 mg by mouth daily.   Yes [provider]  cyclobenzaprine (FLEXERIL) 10 MG tablet Take 10 mg by mouth 3 (three) times daily as needed for muscle spasms.   Yes [provider]  ergocalciferol (VITAMIN D2) 1.25 MG (50000 UT) capsule Take 1 capsule by mouth once a week. 08/24/17  Yes [provider]  ibuprofen (ADVIL,MOTRIN) 800 MG tablet Take 800 mg by mouth every 8 (eight) hours as needed.   Yes [provider]  Multiple Vitamin (MULTIVITAMIN) capsule Take 1 capsule by mouth daily.   Yes [provider]  doxycycline (VIBRAMYCIN) 100 MG capsule Take 1 capsule (100 mg total) by mouth 2 (two) times daily. 05/29/20   Marcus Haber, NP  erythromycin ophthalmic ointment Place a 1/2 inch ribbon of ointment into the lower eyelid 4 times a day. 01/11/17   Sudie Grumbling, NP  Etanercept (ENBREL) 25 MG/0.5ML SOSY Inject into the skin.    [provider]  HYDROcodone-acetaminophen (NORCO/VICODIN) 5-325 MG tablet Take 2 tablets by mouth every 6 (six) hours as needed for moderate pain.    [provider]  ketorolac (TORADOL) 10 MG tablet Take 1 tablet (10 mg total) by mouth every 6 (six) hours as needed. 07/28/17   Darci Current, MD  omeprazole (PRILOSEC) 10 MG capsule Take 10 mg by mouth daily.    [provider]  predniSONE (DELTASONE) 5 MG tablet 6 day taper 6 tablets, 5 tablets, 4 tablets, 3 tablets, 2 tablets, 1 tablet Till finished. 06/14/15   [provider]    Family History Family History  Problem Relation Age of Onset  . Prostate cancer Father   . Hypertension Father   . Hyperlipidemia Father     Social History Social History   Tobacco Use  . Smoking status: Current Every Day Smoker    Packs/day: 1.00    Years: 25.00    Pack years: 25.00  . Smokeless tobacco: Never Used  Vaping Use  . Vaping Use: Never used  Substance Use Topics  . Alcohol use: Yes    Comment: "couple times a week"  . Drug use: No     Allergies   Lorabid [loracarbef]   Review of Systems Review of Systems   Physical  Exam Triage Vital Signs ED Triage Vitals  Enc Vitals Group     BP 05/29/20 0938 139/87     Pulse Rate 05/29/20 0938 89     Resp 05/29/20 0938 15     Temp 05/29/20 0938 97.6 F (36.4 C)     Temp Source 05/29/20 0938 Oral     SpO2 05/29/20 0938 99 %     Weight 05/29/20 0935 245 lb (111.1 kg)     Height 05/29/20 0935 6\' 5"  (1.956 m)     Head Circumference --      Peak Flow --      Pain Score 05/29/20 0934 7     Pain Loc --      Pain Edu? --      Excl. in GC? --    No data found.  Updated Vital Signs BP 139/87 (BP Location: Right Arm)   Pulse 89   Temp 97.6 F (36.4 C) (Oral)   Resp 15   Ht 6\' 5"  (1.956 m)   Wt 245 lb (111.1 kg)   SpO2 99%   BMI 29.05 kg/m   Visual Acuity Right Eye  Distance:   Left Eye Distance:   Bilateral Distance:    Right Eye Near:   Left Eye Near:    Bilateral Near:     Physical Exam Constitutional:      Appearance: He is well-developed.  Cardiovascular:     Rate and Rhythm: Normal rate.  Pulmonary:     Effort: Pulmonary effort is normal.  Skin:    General: Skin is warm and dry.          Comments: Right proximal inner and posterior thigh with 1.5 cm abscess with mild surrounding redness, fluctuant, no drainage, tender   Neurological:     Mental Status: He is alert and oriented to person, place, and time.      UC Treatments / Results  Labs (all labs ordered are listed, but only abnormal results are displayed) Labs Reviewed - No data to display  EKG   Radiology No results found.  Procedures Incision and Drainage  Date/Time: 05/29/2020 10:00 AM Performed by: , NP Authorized by: 13/04/2020, NP   Consent:    Consent obtained:  Verbal   Consent given by:  Patient   Risks discussed:  Bleeding, infection, incomplete drainage and pain   Alternatives discussed:  Observation, alternative treatment, referral and delayed treatment Location:    Type:  Abscess   Size:  1.5   Location:  Lower extremity   Lower extremity location:  Leg   Leg location:  L upper leg Pre-procedure details:    Skin preparation:  Betadine Anesthesia (see MAR for exact dosages):    Anesthesia method:  Topical application   Topical anesthesia: pain ease freeze spray  Procedure type:    Complexity:  Simple Procedure details:    Incision types:  Stab incision   Scalpel blade:  11   Drainage:  Purulent and bloody   Drainage amount:  Moderate   Wound treatment:  Wound left open   Packing materials:  None Post-procedure details:    Patient tolerance of procedure:  Tolerated well, no immediate complications   (including critical care time)  Medications Ordered in UC Medications - No data to display  Initial Impression /  Assessment and Plan / UC Course  I have reviewed the triage vital signs and the nursing notes.  Pertinent labs & imaging results that were available during  my care of the patient were reviewed by me and considered in my medical decision making (see chart for details).    Stab incision with moderate purlent and blood drainage from left upper thigh abscess. Tolerated well. Warm compresses encouraged. Antibiotics provided. Patient verbalized understanding and agreeable to plan.   Final Clinical Impressions(s) / UC Diagnoses   Final diagnoses:  Abscess     Discharge Instructions     Apply heat to the area to promote further drainage.  Cleanse area daily with soap and water.  Keep covered to keep protected and collect drainage.   Complete course of antibiotics.  Return for any further concerns or worsening of symptoms.    ED Prescriptions    Medication Sig Dispense Auth. Provider   doxycycline (VIBRAMYCIN) 100 MG capsule Take 1 capsule (100 mg total) by mouth 2 (two) times daily. 20 capsule Marcus Haber, NP     PDMP not reviewed this encounter.   Marcus Haber, NP 05/29/20 1002

## 2020-05-29 NOTE — ED Triage Notes (Signed)
Patient states that he has an abscess on his left thigh. States that he was seen for this back in august and had resolved. States that pain has been constant since Sunday again, without drainage.

## 2020-06-19 ENCOUNTER — Telehealth: Payer: Self-pay

## 2020-06-19 NOTE — Telephone Encounter (Signed)
Received voicemail from [place] at [facility] inquiring about patient's urgent referral to ID clinic. Patient scheduled to see Dr. Rivka Safer on 12/9. RN attempted to call Pieter Partridge, no answer. RN left voicemail with patient's appointment date and requested patient records and referral be faxed to [phone]. RN left callback number for questions.   Sandie Ano, RN

## 2020-06-19 NOTE — Telephone Encounter (Signed)
Left message to call me at [facility] or on cell so I can schedule URGENT referral from [facility]. Could not reach nurse at [facility] to get referral faxed again. Will try again later

## 2020-06-27 ENCOUNTER — Encounter: Payer: Self-pay | Admitting: Infectious Diseases

## 2020-06-27 ENCOUNTER — Ambulatory Visit: Payer: Managed Care, Other (non HMO) | Attending: Infectious Diseases | Admitting: Infectious Diseases

## 2020-06-27 ENCOUNTER — Other Ambulatory Visit: Payer: Self-pay

## 2020-06-27 ENCOUNTER — Other Ambulatory Visit
Admission: RE | Admit: 2020-06-27 | Discharge: 2020-06-27 | Disposition: A | Discharge status: Home / Self Care | Payer: Managed Care, Other (non HMO) | Source: Ambulatory Visit | Attending: Infectious Diseases | Admitting: Infectious Diseases

## 2020-06-27 VITALS — BP 144/80 | HR 86 | Temp 98.4°F | Resp 16 | Wt 229.7 lb

## 2020-06-27 DIAGNOSIS — L089 Local infection of the skin and subcutaneous tissue, unspecified: Secondary | ICD-10-CM | POA: Diagnosis present

## 2020-06-27 DIAGNOSIS — F17210 Nicotine dependence, cigarettes, uncomplicated: Secondary | ICD-10-CM | POA: Diagnosis not present

## 2020-06-27 DIAGNOSIS — L0889 Other specified local infections of the skin and subcutaneous tissue: Secondary | ICD-10-CM | POA: Insufficient documentation

## 2020-06-27 DIAGNOSIS — B958 Unspecified staphylococcus as the cause of diseases classified elsewhere: Secondary | ICD-10-CM

## 2020-06-27 DIAGNOSIS — Z79899 Other long term (current) drug therapy: Secondary | ICD-10-CM | POA: Diagnosis not present

## 2020-06-27 DIAGNOSIS — M069 Rheumatoid arthritis, unspecified: Secondary | ICD-10-CM | POA: Insufficient documentation

## 2020-06-27 LAB — SURGICAL PCR SCREEN
MRSA, PCR: NEGATIVE
Staphylococcus aureus: NEGATIVE

## 2020-06-27 MED ORDER — MUPIROCIN 2 % EX OINT: 1.0000 "application " | TOPICAL_OINTMENT | Freq: Two times a day (BID) | CUTANEOUS | 0 refills | Status: DC

## 2020-06-27 MED ORDER — CHLORHEXIDINE GLUCONATE 4 % EX LIQD: Freq: Every day | CUTANEOUS | 1 refills | Status: AC | PRN

## 2020-06-27 NOTE — Progress Notes (Signed)
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 Is referred by his PCP Dr. Greggory Stallion. ? Marcus Freeman is a 49 y.o. male with a history of rheumatoid arthritis on orencia which is a selective T-cell costimulation blocker presents with recurrent skin infections for the past 2- 3 yrs As per patient he has had folliculitis since he was a teenager. But recently in the past to 3 years he has had lesions that crop up in the inner thigh and it can increase up to the size of a golf ball.  He has had multiple courses of antibiotics doxycycline, clindamycin.On 3 occasions which was 09/20/2019, 06/08/2019, 10/26/2018 he has had staph lugdunensis cultured from these lesions.  But no susceptibility has been done. He does not have diabetes. He does not have any fever. 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 He lives with his wife and children and no one has any infection with staph aureus.  He is got 2 dogs. Past Medical History:  Diagnosis Date  . Rheumatoid arthritis [facility])     Past Surgical History:  Procedure Laterality Date  . KNEE SURGERY Bilateral 1991    Social History   Socioeconomic History  . Marital status: Married    Spouse name: Not on file  . Number of children: Not on file  . Years of education: Not on file  . Highest education level: Not on file  Occupational History  . Not on file  Tobacco Use  . Smoking status: Current Every Day Smoker    Packs/day: 1.00    Years: 25.00    Pack years: 25.00  . Smokeless tobacco: Never Used  Vaping Use  . Vaping Use: Never used  Substance and Sexual Activity  . Alcohol use: Yes    Comment: "couple times a week"  . Drug use: No  . Sexual activity: Not on file  Other Topics Concern  . Not on file  Social History Narrative  . Not on file   Social  Determinants of Health   Financial Resource Strain: Not on file  Food Insecurity: Not on file  Transportation Needs: Not on file  Physical Activity: Not on file  Stress: Not on file  Social Connections: Not on file  Intimate Partner Violence: Not on file    Family History  Problem Relation Age of Onset  . Prostate cancer Father   . Hypertension Father   . Hyperlipidemia Father    Allergies  Allergen Reactions  . Lorabid [Loracarbef] Hives     ? Current Outpatient Medications  Medication Sig Dispense Refill  . Abatacept (ORENCIA) 125 MG/ML SOSY Inject into the skin.    . cetirizine (ZYRTEC) 10 MG tablet Take 10 mg by mouth daily.    . cyclobenzaprine (FLEXERIL) 10 MG tablet Take 10 mg by mouth 3 (three) times daily as needed for muscle spasms.    . ergocalciferol (VITAMIN D2) 1.25 MG (50000 UT) capsule Take 1 capsule by mouth once a week.    . Garlic 100 MG TABS Take by mouth.    Marland Kitchen ibuprofen (ADVIL,MOTRIN) 800 MG tablet Take 800 mg by mouth every 8 (eight) hours as needed.    . Multiple Vitamin (MULTIVITAMIN) capsule Take 1 capsule by mouth daily.    . predniSONE (DELTASONE) 5 MG tablet 6 day taper 6 tablets,  5 tablets, 4 tablets, 3 tablets, 2 tablets, 1 tablet Till finished. (Patient not taking: Reported on 06/27/2020)     No current facility-administered medications for this visit.     Abtx:  Anti-infectives (From admission, onward)   None      REVIEW OF SYSTEMS:  Const: negative fever, negative chills, negative weight loss Eyes: negative diplopia or visual changes, negative eye pain ENT: negative coryza, negative sore throat Resp: negative cough, hemoptysis, dyspnea Cards: negative for chest pain, palpitations, lower extremity edema GU: negative for frequency, dysuria and hematuria GI: Negative for abdominal pain, diarrhea, bleeding, constipation Skin: negative for rash and pruritus Heme: negative for easy bruising and gum/nose bleeding MS: Has pain in his joints  and muscles.  Neurolo:negative for headaches, dizziness, vertigo, memory problems  Psych: negative for feelings of anxiety, depression  Endocrine: negative for thyroid, diabetes Allergy/Immunology-as above.  ?  Objective:  VITALS:  BP (!) 144/80   Pulse 86   Temp 98.4 F (36.9 C) (Temporal)   Resp 16   Wt 229 lb 11.5 oz (104.2 kg)   SpO2 95%   BMI 27.24 kg/m  PHYSICAL EXAM:  General: Alert, cooperative, no distress, appears stated age.  Head: Normocephalic, without obvious abnormality, atraumatic. Eyes: Conjunctivae clear, anicteric sclerae. Pupils are equal ENT Nares normal. No drainage or sinus tenderness. Lips, mucosa, and tongue normal. No Thrush Neck: Supple, symmetrical, no adenopathy, thyroid: non tender no carotid bruit and no JVD. Back: No CVA tenderness. Lungs: Clear to auscultation bilaterally. No Wheezing or Rhonchi. No rales. Heart: Regular rate and rhythm, no murmur, rub or gallop. Abdomen: Soft, non-tender,not distended. Bowel sounds normal. No masses Extremities: atraumatic, no cyanosis. No edema. No clubbing Skin: Multiple small papular lesions scattered over his inner thighs and a few on his back. None had any erythema or tenderness.  No purulence noted    Lymph: Cervical, supraclavicular normal. Neurologic: Grossly non-focal ? Impression/Recommendation 49 year old male with history of rheumatoid arthritis on Orencia.  ? 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  Multiple recurrences in the past 2 years.  Previously has taken clindamycin and doxycycline.  There is no susceptibility available.  Today we will check a nares swab for staph colonization as well as an inner thigh culture. No active lesion was seen today Discussed decolonization protocol with mupirocin and chlorhexidine bathing. Patient is immunocompromised with being on Orencia.  No history of diabetes Prescription sent for mupirocin and chlorhexidine If he has active lesions he will call  my office and be be seen . We will follow as needed.  ___Discussed the management in great detail with the patient.    Note:  This document was prepared using Dragon voice recognition software and may include unintentional dictation errors.

## 2020-06-27 NOTE — Patient Instructions (Addendum)
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 So need to quit    Preventive educational messages on personal hygiene and appropriate wound care are recommended for all patients with SSTI. I   PERSONAL HYGIENE  i. Keep draining wounds covered with clean, dry bandages . ii. Maintain good personal hygiene with regular bathing and cleaning of hands with soap and water or an alcohol-based hand gel, particularly after touching infected skin or an item that has directly contacted a draining wound   iii. Avoid reusing or sharing personal items (eg, disposable razors, linens, and towels) that have contacted infected skin   ENVIRONMENTAL HYGIENE  Environmental hygiene measures should be considered in patients with recurrent SSTI in the household or community setting:  i. Focus cleaning efforts on high-touch surfaces (ie, surfaces that come into frequent contact with people's bare skin each day, such as counters, door knobs, bath tubs, and toilet seats) that may contact bare skin or uncovered infections  ii. Commercially available cleaners or detergents appropriate for the surface being cleaned should be used according to label instructions for routine cleaning of surfaces   . Decolonization strategies should be offered in conjunction with ongoing reinforcement of hygiene measures    i. . Nasal decolonization with mupirocin twice daily for 5-10 days and topical body decolonization regimens with a skin antiseptic solution (eg, chlorhexidine) for 5-14 days  If the above does not work then you  can do  dilute bleach baths. (For dilute bleach baths, 1 teaspoon per gallon of water [or  cup per  tub or 13 gallons of water] given for 15 min twice weekly for ~3 months can be considered.)

## 2020-06-29 LAB — AEROBIC CULTURE W GRAM STAIN (SUPERFICIAL SPECIMEN)
Culture: NORMAL
Gram Stain: NONE SEEN

## 2020-07-01 ENCOUNTER — Telehealth: Payer: Self-pay | Admitting: *Deleted

## 2020-07-01 NOTE — Telephone Encounter (Signed)
Patient's primary care office left voicemail checking on referral.  Patient was seen 12/9 by Dr Rivka Safer.  RN forwarded office notes as requested to fax: [phone], left message for Crystal at [facility] letting her know he was seen in office. Andree Coss, RN

## 2020-07-11 ENCOUNTER — Telehealth: Payer: Self-pay

## 2020-07-11 NOTE — Telephone Encounter (Signed)
Left message to call and let us know how things are. I also stated cultures negative. Will await or try calling again Monday.

## 2020-07-11 NOTE — Telephone Encounter (Signed)
Marcus Freeman, please let the patient know that his culture of the skin and nose did not show any bacteria. Ask him whether he is doing better with the chlorhexidine baths and mupirocin? if he gets new lesions he can come to see me. thx

## 2020-07-17 NOTE — Telephone Encounter (Signed)
LM x2 to call office to discuss results

## 2022-06-16 ENCOUNTER — Ambulatory Visit
Admission: EM | Admit: 2022-06-16 | Discharge: 2022-06-16 | Disposition: A | Discharge status: Home / Self Care | Payer: Managed Care, Other (non HMO) | Attending: Emergency Medicine | Admitting: Emergency Medicine

## 2022-06-16 DIAGNOSIS — H00015 Hordeolum externum left lower eyelid: Secondary | ICD-10-CM | POA: Diagnosis not present

## 2022-06-16 DIAGNOSIS — L03211 Cellulitis of face: Secondary | ICD-10-CM | POA: Diagnosis not present

## 2022-06-16 MED ORDER — AMOXICILLIN-POT CLAVULANATE 875-125 MG PO TABS: 1.0000 | ORAL_TABLET | Freq: Two times a day (BID) | ORAL | 0 refills | Status: AC

## 2022-06-16 MED ORDER — SULFAMETHOXAZOLE-TRIMETHOPRIM 800-160 MG PO TABS: 1.0000 | ORAL_TABLET | Freq: Two times a day (BID) | ORAL | 0 refills | Status: AC

## 2022-06-16 NOTE — Discharge Instructions (Signed)
Finish antibiotics, even if you feel better.  This will take care of a skin infection.  Start doing warm compresses 4 times a day. You may get some baby shampoo, dilute this with warm water, and gently wipe your upper and lower eyelid while you are taking a shower. This will help prevent the recurrence of any styes or chalazions. Follow up with ophthalmologist if this does not get better within several days. Try not to rub your eyes. You may use Systane as often as you want for comfort. Return immediately to the ER for fever above 100.4, if you have pain moving your eyes, any visual changes, nausea, vomiting, headaches, a rash around your eye, or any other concerns.  Go to www.goodrx.com  or www.costplusdrugs.com to look up your medications. This will give you a list of where you can find your prescriptions at the most affordable prices. Or ask the pharmacist what the cash price is, or if they have any other discount programs available to help make your medication more affordable. This can be less expensive than what you would pay with insurance.

## 2022-06-16 NOTE — ED Triage Notes (Signed)
Pt c/o left eye swelling and facial pain x8days  Pt states that there Is a lump along his cheek that is tender to touch and a "hard line" going down from the corner of his left eye down his face.

## 2022-06-16 NOTE — ED Provider Notes (Signed)
HPI  SUBJECTIVE:  Marcus Freeman is a 51 y.o. male who presents with 2 weeks of left lower eyelid swelling, pain present only with closing his eye.  He reports nontender infraorbital swelling and tender left facial swelling anterior to his left ear starting 2 to 3 days ago.  He reports increased tearing and blurry vision due to the eyelid eczema.  No eye pain, photophobia, pain with extraocular movements, conjunctival injection, discharge or crusting.  No fevers, rash, dental pain, trauma to the face, ear pain, trismus.  No recent viral illnesses.  He wears glasses.  He does not wear contacts.  No recent change in medications.  No antipyretic in the past 6 hours.  He has been taking ibuprofen and has applied warm compresses to his eye without improvement in his symptoms.  Symptoms are worse with squeezing his eyes shut.  It is not associated with chewing or yawning.  He has a past medical history of rheumatoid arthritis, on Plaquenil and Rinvoq which he discontinued 2 days ago, and varicella. no history of MRSA, shingles.  PCP: [facility]  Past Medical History:  Diagnosis Date   Rheumatoid arthritis (HCC)     Past Surgical History:  Procedure Laterality Date   KNEE SURGERY Bilateral 1991    Family History  Problem Relation Age of Onset   Prostate cancer Father    Hypertension Father    Hyperlipidemia Father     Social History   Tobacco Use   Smoking status: Every Day    Packs/day: 1.00    Years: 25.00    Total pack years: 25.00    Types: Cigarettes   Smokeless tobacco: Never  Vaping Use   Vaping Use: Never used  Substance Use Topics   Alcohol use: Yes    Comment: "couple times a week"   Drug use: No    No current facility-administered medications for this encounter.  Current Outpatient Medications:    amoxicillin-clavulanate (AUGMENTIN) 875-125 MG tablet, Take 1 tablet by mouth every 12 (twelve) hours., Disp: 14 tablet, Rfl: 0   cetirizine (ZYRTEC) 10 MG  tablet, Take 10 mg by mouth daily., Disp: , Rfl:    chlorhexidine (HIBICLENS) 4 % external liquid, Apply topically daily as needed. Use as directed - not on face or genital area, Disp: 120 mL, Rfl: 1   cyclobenzaprine (FLEXERIL) 10 MG tablet, Take 10 mg by mouth 3 (three) times daily as needed for muscle spasms., Disp: , Rfl:    ergocalciferol (VITAMIN D2) 1.25 MG (50000 UT) capsule, Take 1 capsule by mouth once a week., Disp: , Rfl:    Garlic 100 MG TABS, Take by mouth., Disp: , Rfl:    hydroxychloroquine (PLAQUENIL) 200 MG tablet, Take by mouth., Disp: , Rfl:    ibuprofen (ADVIL,MOTRIN) 800 MG tablet, Take 800 mg by mouth every 8 (eight) hours as needed., Disp: , Rfl:    Multiple Vitamin (MULTIVITAMIN) capsule, Take 1 capsule by mouth daily., Disp: , Rfl:    RINVOQ 15 MG TB24, Take by mouth., Disp: , Rfl:    sulfamethoxazole-trimethoprim (BACTRIM DS) 800-160 MG tablet, Take 1 tablet by mouth 2 (two) times daily for 10 days., Disp: 20 tablet, Rfl: 0   Abatacept (ORENCIA) 125 MG/ML SOSY, Inject into the skin., Disp: , Rfl:   Allergies  Allergen Reactions   Lorabid [Loracarbef] Hives     ROS  As noted in HPI.   Physical Exam  BP (!) 147/82 (BP Location: Left Arm)   Pulse Marland Kitchen)  53   Temp 98.1 F (36.7 C) (Oral)   Resp 18   Ht 6\' 5"  (1.956 m)   Wt 115.7 kg   SpO2 96%   BMI 30.24 kg/m   Constitutional: Well developed, well nourished, no acute distress Eyes:  EOMI, conjunctiva normal bilaterally, PERRLA, no pain with EOMs.  Left eye: Positive yellow drainage.  No direct or consensual photophobia.  Positive stye/draining lesion lower medial eyelid.  Positive nontender infraorbital swelling.   Visual Acuity  Right Eye Distance: 20/20 Left Eye Distance: 20/70 Bilateral Distance: 20/25  Right Eye Near:   Left Eye Near:    Bilateral Near:   (Pt wears Glasses)              HENT: Normocephalic, atraumatic,mucus membranes moist.  No facial rash.  Positive tenderness at the  angle of the jaw, swelling.  No expressible drainage from Stensen's duct.  No tenderness, swelling underneath the tongue.  Positive left-sided cervical lymphadenopathy Respiratory: Normal inspiratory effort Cardiovascular: Normal rate GI: nondistended skin: No rash, skin intact Musculoskeletal: no deformities Neurologic: Alert & oriented x 3, no focal neuro deficits Psychiatric: Speech and behavior appropriate   ED Course   Medications - No data to display  Orders Placed This Encounter  Procedures   Visual acuity screening    Standing Status:   Standing    Number of Occurrences:   1    No results found for this or any previous visit (from the past 24 hour(s)). No results found.  ED Clinical Impression  1. Facial cellulitis   2. Hordeolum externum of left lower eyelid      ED Assessment/Plan     Presentation consistent with an infraorbital cellulitis from a stye.  He has tenderness anterior to the left ear, and some swelling, but it does not appear to be parotid gland swelling.  Do not think this is suppurative parotitis. I suspect that these are swollen, tender preauricular lymph nodes. Doubt postseptal cellulitis.  Shingles in the differential, but symptoms have been present for 2 weeks, and he has no lesions other than the one on his medial eyelid.  He is out of the treatment window for antivirals anyway.  Will treat as periorbital cellulitis with Bactrim 1 tab twice daily and Augmentin 875 mg twice daily for 10 days.  Continue warm compresses.  Lid hygiene.  Follow-up with ophthalmology or PCP if not getting any better in 3 days.  ER return precautions given.  Discussed MDM, treatment plan, and plan for follow-up with patient. Discussed sn/sx that should prompt return to the ED. patient agrees with plan.   Meds ordered this encounter  Medications   sulfamethoxazole-trimethoprim (BACTRIM DS) 800-160 MG tablet    Sig: Take 1 tablet by mouth 2 (two) times daily for 10 days.     Dispense:  20 tablet    Refill:  0   amoxicillin-clavulanate (AUGMENTIN) 875-125 MG tablet    Sig: Take 1 tablet by mouth every 12 (twelve) hours.    Dispense:  14 tablet    Refill:  0      *This clinic note was created using . Therefore, there may be occasional mistakes despite careful proofreading.  ?    Scientist, clinical (histocompatibility and immunogenetics), MD 06/17/22 [phone]

## 2024-03-27 ENCOUNTER — Ambulatory Visit: Admission: EM | Admit: 2024-03-27 | Discharge: 2024-03-27 | Disposition: A | Discharge status: Home / Self Care

## 2024-03-27 ENCOUNTER — Encounter: Payer: Self-pay | Admitting: Emergency Medicine

## 2024-03-27 DIAGNOSIS — L03119 Cellulitis of unspecified part of limb: Secondary | ICD-10-CM

## 2024-03-27 DIAGNOSIS — L02419 Cutaneous abscess of limb, unspecified: Secondary | ICD-10-CM

## 2024-03-27 MED ORDER — DOXYCYCLINE HYCLATE 100 MG PO CAPS: 100.0000 mg | ORAL_CAPSULE | Freq: Two times a day (BID) | ORAL | 0 refills | Status: AC

## 2024-03-27 NOTE — ED Triage Notes (Signed)
 Patient reports abscess to left posterior thigh area that started 3 weeks go. Patient reports he noticed on Wednesday increase swelling, redness and pain. Patient has used neosporin with no improvement. Rates pain 2/10 and states pain increased when area is touched.

## 2024-03-27 NOTE — ED Provider Notes (Signed)
 [facility]  Note:  This document was prepared using Conservation officer, historic buildings and may include unintentional dictation errors.  MRN: [id number] DOB: 02-Dec-1970  Subjective:   Marcus Freeman is a 53 y.o. male presenting for swelling, redness, warmth to the left upper leg.  Patient reports that he first noticed some redness about 3 weeks ago.  Patient states that on Wednesday the swelling increased redness and pain was much worse.  Patient has been using Neosporin over the site with no improvement.  Patient reports currently the pain is 2 out of 10 but increases with direct palpation and ambulation.  Patient reports he has bad history of abscesses in the past but no swelling or erythema is significant as this.  Patient does have an appointment with his doctor tomorrow but was concerned that the infection may be getting worse and that he should get antibiotic therapy to begin the healing process.  No current facility-administered medications for this encounter.  Current Outpatient Medications:    doxycycline  (VIBRAMYCIN ) 100 MG capsule, Take 1 capsule (100 mg total) by mouth 2 (two) times daily., Disp: 20 capsule, Rfl: 0   Abatacept (ORENCIA) 125 MG/ML SOSY, Inject into the skin., Disp: , Rfl:    amoxicillin -clavulanate (AUGMENTIN ) 875-125 MG tablet, Take 1 tablet by mouth every 12 (twelve) hours., Disp: 14 tablet, Rfl: 0   cetirizine (ZYRTEC) 10 MG tablet, Take 10 mg by mouth daily., Disp: , Rfl:    chlorhexidine  (HIBICLENS ) 4 % external liquid, Apply topically daily as needed. Use as directed - not on face or genital area, Disp: 120 mL, Rfl: 1   cyclobenzaprine (FLEXERIL) 10 MG tablet, Take 10 mg by mouth 3 (three) times daily as needed for muscle spasms., Disp: , Rfl:    ergocalciferol (VITAMIN D2) 1.25 MG (50000 UT) capsule, Take 1 capsule by mouth once a week., Disp: , Rfl:    Garlic 100 MG TABS, Take by mouth., Disp: , Rfl:    ibuprofen (ADVIL,MOTRIN) 800 MG  tablet, Take 800 mg by mouth every 8 (eight) hours as needed., Disp: , Rfl:    Multiple Vitamin (MULTIVITAMIN) capsule, Take 1 capsule by mouth daily., Disp: , Rfl:    RINVOQ 15 MG TB24, Take by mouth., Disp: , Rfl:    Allergies  Allergen Reactions   Lorabid [Loracarbef] Hives    Past Medical History:  Diagnosis Date   Rheumatoid arthritis (HCC)      Past Surgical History:  Procedure Laterality Date   KNEE SURGERY Bilateral 1991    Family History  Problem Relation Age of Onset   Prostate cancer Father    Hypertension Father    Hyperlipidemia Father     Social History   Tobacco Use   Smoking status: Every Day    Current packs/day: 1.00    Average packs/day: 1 pack/day for 25.0 years (25.0 ttl pk-yrs)    Types: Cigarettes   Smokeless tobacco: Never  Vaping Use   Vaping status: Never Used  Substance Use Topics   Alcohol use: Yes    Comment: couple times a week   Drug use: No    ROS Refer to HPI for ROS details.  Objective:   Vitals: BP 126/84 (BP Location: Right Arm)   Pulse 82   Temp 98.1 F (36.7 C) (Oral)   Resp 20   SpO2 97%   Physical Exam Vitals and nursing note reviewed.  Constitutional:      General: He is not in acute distress.  Appearance: He is well-developed. He is not ill-appearing or toxic-appearing.  HENT:     Head: Normocephalic.  Cardiovascular:     Rate and Rhythm: Normal rate.  Pulmonary:     Effort: Pulmonary effort is normal. No respiratory distress.  Skin:    General: Skin is warm and dry.     Findings: Abscess and erythema present.      Neurological:     General: No focal deficit present.     Mental Status: He is alert and oriented to person, place, and time.  Psychiatric:        Mood and Affect: Mood normal.        Behavior: Behavior normal.     Procedures  No results found for this or any previous visit (from the past 24 hours).  No results found.   Assessment and Plan :     Discharge Instructions        1. Cellulitis and abscess of leg (Primary) - doxycycline  (VIBRAMYCIN ) 100 MG capsule; Take 1 capsule (100 mg total) by mouth 2 (two) times daily.  Dispense: 20 capsule; Refill: 0 - Take ibuprofen or naproxen as needed for pain and inflammation secondary to cellulitis/early upper leg abscess. - Apply warm compress 2-3 times a day for 10 minutes to help increase blood flow to the area aiding in antibiotic infiltrating the tissue where the infection is. -Continue to monitor symptoms for any change in severity if there is any escalation of current symptoms or development of new symptoms follow-up in ER or UC for further evaluation and management.      Ellagrace Yoshida B Giovannina Mun   Nancye Grumbine, [place], [place] 03/27/24 1710

## 2024-03-27 NOTE — Discharge Instructions (Signed)
  1. Cellulitis and abscess of leg (Primary) - doxycycline  (VIBRAMYCIN ) 100 MG capsule; Take 1 capsule (100 mg total) by mouth 2 (two) times daily.  Dispense: 20 capsule; Refill: 0 - Take ibuprofen or naproxen as needed for pain and inflammation secondary to cellulitis/early upper leg abscess. - Apply warm compress 2-3 times a day for 10 minutes to help increase blood flow to the area aiding in antibiotic infiltrating the tissue where the infection is. -Continue to monitor symptoms for any change in severity if there is any escalation of current symptoms or development of new symptoms follow-up in ER or UC for further evaluation and management.
